# Patient Record
Sex: Female | Born: 1995
Health system: Southern US, Community
[De-identification: ages and names within clinical notes are randomized; demographics above are authoritative.]

---
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
## 2016-01-27 DIAGNOSIS — G43109 Migraine with aura, not intractable, without status migrainosus: Secondary | ICD-10-CM | POA: Diagnosis not present

## 2016-01-27 DIAGNOSIS — Z1389 Encounter for screening for other disorder: Secondary | ICD-10-CM | POA: Diagnosis not present

## 2016-01-27 DIAGNOSIS — N926 Irregular menstruation, unspecified: Secondary | ICD-10-CM | POA: Diagnosis not present

## 2016-01-27 DIAGNOSIS — Z23 Encounter for immunization: Secondary | ICD-10-CM | POA: Diagnosis not present

## 2017-04-10 DIAGNOSIS — Z23 Encounter for immunization: Secondary | ICD-10-CM | POA: Diagnosis not present

## 2017-06-29 DIAGNOSIS — J0101 Acute recurrent maxillary sinusitis: Secondary | ICD-10-CM | POA: Diagnosis not present

## 2017-07-03 DIAGNOSIS — N926 Irregular menstruation, unspecified: Secondary | ICD-10-CM | POA: Diagnosis not present

## 2017-07-03 DIAGNOSIS — G43109 Migraine with aura, not intractable, without status migrainosus: Secondary | ICD-10-CM | POA: Diagnosis not present

## 2018-06-03 DIAGNOSIS — R509 Fever, unspecified: Secondary | ICD-10-CM | POA: Diagnosis not present

## 2018-06-03 DIAGNOSIS — Z6832 Body mass index (BMI) 32.0-32.9, adult: Secondary | ICD-10-CM | POA: Diagnosis not present

## 2018-06-03 DIAGNOSIS — R5383 Other fatigue: Secondary | ICD-10-CM | POA: Diagnosis not present

## 2018-06-03 DIAGNOSIS — R070 Pain in throat: Secondary | ICD-10-CM | POA: Diagnosis not present

## 2018-06-03 DIAGNOSIS — B349 Viral infection, unspecified: Secondary | ICD-10-CM | POA: Diagnosis not present

## 2018-07-19 DIAGNOSIS — Z6832 Body mass index (BMI) 32.0-32.9, adult: Secondary | ICD-10-CM | POA: Diagnosis not present

## 2018-07-19 DIAGNOSIS — N3090 Cystitis, unspecified without hematuria: Secondary | ICD-10-CM | POA: Diagnosis not present

## 2018-10-06 DIAGNOSIS — N926 Irregular menstruation, unspecified: Secondary | ICD-10-CM | POA: Diagnosis not present

## 2018-10-06 DIAGNOSIS — G43109 Migraine with aura, not intractable, without status migrainosus: Secondary | ICD-10-CM | POA: Diagnosis not present

## 2018-10-06 DIAGNOSIS — Z23 Encounter for immunization: Secondary | ICD-10-CM | POA: Diagnosis not present

## 2018-10-06 DIAGNOSIS — Z6834 Body mass index (BMI) 34.0-34.9, adult: Secondary | ICD-10-CM | POA: Diagnosis not present

## 2018-10-28 DIAGNOSIS — G43109 Migraine with aura, not intractable, without status migrainosus: Secondary | ICD-10-CM | POA: Diagnosis not present

## 2018-10-28 DIAGNOSIS — N926 Irregular menstruation, unspecified: Secondary | ICD-10-CM | POA: Diagnosis not present

## 2018-10-28 DIAGNOSIS — Z6834 Body mass index (BMI) 34.0-34.9, adult: Secondary | ICD-10-CM | POA: Diagnosis not present

## 2018-12-19 DIAGNOSIS — Z20828 Contact with and (suspected) exposure to other viral communicable diseases: Secondary | ICD-10-CM | POA: Diagnosis not present

## 2019-02-06 ENCOUNTER — Other Ambulatory Visit: Payer: Self-pay

## 2019-02-06 DIAGNOSIS — Z20822 Contact with and (suspected) exposure to covid-19: Secondary | ICD-10-CM

## 2019-02-07 LAB — NOVEL CORONAVIRUS, NAA: SARS-CoV-2, NAA: NOT DETECTED

## 2019-02-09 ENCOUNTER — Telehealth: Payer: Self-pay | Admitting: Family Medicine

## 2019-02-09 NOTE — Telephone Encounter (Signed)
Negative COVID results given. Patient results "NOT Detected." Caller expressed understanding. ° °

## 2020-01-05 ENCOUNTER — Emergency Department (HOSPITAL_COMMUNITY): Payer: 59

## 2020-01-05 ENCOUNTER — Other Ambulatory Visit: Payer: Self-pay

## 2020-01-05 ENCOUNTER — Encounter (HOSPITAL_COMMUNITY): Payer: Self-pay | Admitting: *Deleted

## 2020-01-05 ENCOUNTER — Emergency Department (HOSPITAL_COMMUNITY)
Admission: EM | Admit: 2020-01-05 | Discharge: 2020-01-05 | Disposition: A | Discharge status: Home / Self Care | Payer: 59 | Attending: Emergency Medicine | Admitting: Emergency Medicine

## 2020-01-05 DIAGNOSIS — Z20822 Contact with and (suspected) exposure to covid-19: Secondary | ICD-10-CM

## 2020-01-05 DIAGNOSIS — J189 Pneumonia, unspecified organism: Secondary | ICD-10-CM | POA: Insufficient documentation

## 2020-01-05 DIAGNOSIS — R509 Fever, unspecified: Secondary | ICD-10-CM | POA: Diagnosis present

## 2020-01-05 LAB — SARS CORONAVIRUS 2 BY RT PCR (HOSPITAL ORDER, PERFORMED IN ~~LOC~~ HOSPITAL LAB): SARS Coronavirus 2: NEGATIVE

## 2020-01-05 MED ORDER — AMOXICILLIN 500 MG PO CAPS: 1000.0000 mg | ORAL_CAPSULE | Freq: Three times a day (TID) | ORAL | 0 refills | Status: AC

## 2020-01-05 MED ORDER — ACETAMINOPHEN 325 MG PO TABS
650.0000 mg | ORAL_TABLET | Freq: Once | ORAL | Status: AC | PRN
  Administered 2020-01-05: 650 mg via ORAL
  Filled 2020-01-05: qty 2

## 2020-01-05 MED ORDER — AZITHROMYCIN 250 MG PO TABS: 250.0000 mg | ORAL_TABLET | Freq: Every day | ORAL | 0 refills | Status: AC

## 2020-01-05 NOTE — ED Provider Notes (Signed)
[facility] EMERGENCY DEPARTMENT Provider Note   CSN: [id number] Arrival date & time: 01/05/20  1119     History Chief Complaint  Patient presents with  . Covid Exposure    Sheri Jacobson is a 24 y.o. female.  Sheri Jacobson is a 24 y.o. female who presented today for evaluation of flu-like symptoms after possible COVID exposure. Pt stated that she has been having fevers, chills, cough, and general malaise for about 3-4 days. She was possibly exposed to COVID sometime last week. No one in her home is currently sick. She was vaccinated against COVID-19 in march 2021. Pt stated that her highest fever was 102 in the ED today. She was given tylenol and her fever has improved. Pt endorses sweating, chills, and a cough that was initially dry but has recently become productive with green sputum. She endorses some mild nausea this morning that has subsided. Denies vomiting, diarrhea, or abdominal pain. Pt stated that she has mild shortness of breath when she is up and walking but feels better with rest. No loss of taste or smell. No other known sick contacts.  No other aggravating or alleviating factors.        History reviewed. No pertinent past medical history.  There are no problems to display for this patient.   History reviewed. No pertinent surgical history.   OB History   No obstetric history on file.     No family history on file.  Social History   Tobacco Use  . Smoking status: Never Smoker  . Smokeless tobacco: Never Used  Substance Use Topics  . Alcohol use: No  . Drug use: No    Home Medications Prior to Admission medications   Medication Sig Start Date End Date Taking? Authorizing Provider  amoxicillin (AMOXIL) 500 MG capsule Take 2 capsules (1,000 mg total) by mouth 3 (three) times daily for 5 days. 01/05/20 01/10/20  Dartha Lodge, PA-C  azithromycin (ZITHROMAX) 250 MG tablet Take 1 tablet (250 mg total) by mouth daily. Take first 2 tablets together, then 1  every day until finished. 01/05/20   Dartha Lodge, PA-C  ibuprofen (ADVIL,MOTRIN) 800 MG tablet Take 1 tablet (800 mg total) by mouth every 8 (eight) hours as needed for pain. 12/10/12   Elwin Mocha, MD    Allergies    Patient has no known allergies.  Review of Systems   Review of Systems  Constitutional: Positive for chills and fever.  HENT: Positive for congestion. Negative for rhinorrhea and sore throat.   Respiratory: Positive for cough and shortness of breath.   Cardiovascular: Negative for chest pain.  Gastrointestinal: Positive for nausea. Negative for abdominal pain, diarrhea and vomiting.  Musculoskeletal: Negative for arthralgias and myalgias.  Skin: Negative for color change and rash.  Neurological: Negative for headaches.  All other systems reviewed and are negative.   Physical Exam Updated Vital Signs BP 108/67   Pulse 95   Temp 98.2 F (36.8 C) (Oral)   Resp 16   Ht 5\' 6"  (1.676 m)   Wt 108.9 kg   LMP 12/05/2019   SpO2 99%   BMI 38.74 kg/m   Physical Exam Vitals and nursing note reviewed.  Constitutional:      General: She is not in acute distress.    Appearance: Normal appearance. She is well-developed. She is not ill-appearing or diaphoretic.     Comments: Well-appearing and in no distress  HENT:     Head: Normocephalic and atraumatic.  Mouth/Throat:     Mouth: Mucous membranes are moist.     Pharynx: Oropharynx is clear.  Eyes:     General:        Right eye: No discharge.        Left eye: No discharge.  Neck:     Comments: No rigidity Cardiovascular:     Rate and Rhythm: Normal rate and regular rhythm.     Heart sounds: Normal heart sounds. No murmur heard.  No friction rub. No gallop.   Pulmonary:     Effort: Pulmonary effort is normal. No respiratory distress.     Breath sounds: Normal breath sounds.     Comments: Respirations equal and unlabored, patient able to speak in full sentences, lungs clear to auscultation  bilaterally Abdominal:     General: Bowel sounds are normal. There is no distension.     Palpations: Abdomen is soft. There is no mass.     Tenderness: There is no abdominal tenderness. There is no guarding.     Comments: Abdomen soft, nondistended, nontender to palpation in all quadrants without guarding or peritoneal signs  Musculoskeletal:        General: No deformity.     Cervical back: Neck supple.  Lymphadenopathy:     Cervical: No cervical adenopathy.  Skin:    General: Skin is warm and dry.     Capillary Refill: Capillary refill takes less than 2 seconds.  Neurological:     Mental Status: She is alert and oriented to person, place, and time.     Coordination: Coordination normal.  Psychiatric:        Mood and Affect: Mood normal.        Behavior: Behavior normal.     ED Results / Procedures / Treatments   Labs (all labs ordered are listed, but only abnormal results are displayed) Labs Reviewed  SARS CORONAVIRUS 2 BY RT PCR (HOSPITAL ORDER, PERFORMED IN [facility])    EKG None  Radiology DG Chest Portable 1 View  Result Date: 01/05/2020 CLINICAL DATA:  Cough question of COVID EXAM: PORTABLE CHEST 1 VIEW COMPARISON:  None. FINDINGS: The heart size and mediastinal contours are within normal limits. Mildly hazy airspace opacity seen at the right lung base. The left lung is clear. No pleural effusion. The visualized skeletal structures are unremarkable. IMPRESSION: Mildly hazy airspace opacity at the right lung base which could be due to atelectasis and/or early infectious etiology. Electronically Signed   By: Jonna Clark M.D.   On: 01/05/2020 15:32    Procedures Procedures (including critical care time)  Medications Ordered in ED Medications  acetaminophen (TYLENOL) tablet 650 mg (650 mg Oral Given 01/05/20 1145)    ED Course  I have reviewed the triage vital signs and the nursing notes.  Pertinent labs & imaging results that were available during  my care of the patient were reviewed by me and considered in my medical decision making (see chart for details).    MDM Rules/Calculators/A&P                          24 year old female who has been vaccinated against Covid presents with 3 to 4 days of fevers, chills, malaise, cough and shortness of breath, she did have a possible Covid exposure last week.  Reports cough has become increasingly productive.  No associated chest pain.  Mild nausea this morning but no abdominal pain or diarrhea.  No other focal  symptoms.  Patient's Covid test here in the emergency department is negative.  Her chest x-ray is concerning for right lower lobe pneumonia and given patient's fever and symptoms with negative Covid test will treat for bacterial pneumonia, with amoxicillin and azithromycin.  If symptoms or not improving after 2 to 3 days of antibiotics I would encourage the patient to get retested for Covid.  Strict return precautions and supportive care discussed.  Patient expresses understanding and agreement.  Discharged home in good condition.  Sheri Jacobson was evaluated in Emergency Department on 01/07/2020 for the symptoms described in the history of present illness. She was evaluated in the context of the global COVID-19 pandemic, which necessitated consideration that the patient might be at risk for infection with the SARS-CoV-2 virus that causes COVID-19. Institutional protocols and algorithms that pertain to the evaluation of patients at risk for COVID-19 are in a state of rapid change based on information released by regulatory bodies including the CDC and federal and state organizations. These policies and algorithms were followed during the patient's care in the ED.  Final Clinical Impression(s) / ED Diagnoses Final diagnoses:  Community acquired pneumonia of right lower lobe of lung  Exposure to COVID-19 virus    Rx / DC Orders ED Discharge Orders         Ordered    amoxicillin (AMOXIL) 500 MG  capsule  3 times daily        01/05/20 1631    azithromycin (ZITHROMAX) 250 MG tablet  Daily        01/05/20 1631           Dartha Lodge, [place] 01/07/20 1249    Bethann Berkshire, MD 01/11/20 [phone]

## 2020-01-05 NOTE — Discharge Instructions (Signed)
Your Covid test today was negative.  Your chest x-ray shows signs of pneumonia, please take antibiotics as directed.  If your symptoms are not improving in 2 to 3 days you should have a repeat Covid test done.   Use Motrin and Tylenol as needed for fevers and chills.  Follow-up with your primary care doctor as needed.

## 2020-01-05 NOTE — ED Triage Notes (Signed)
Possible covid exposure, referred here by PCP

## 2022-03-11 IMAGING — DX DG CHEST 1V PORT
1 series · 1 of 1 positions shown · non-contrast
Comparison: None.

CLINICAL DATA: Cough question of COVID

EXAM:
PORTABLE CHEST 1 VIEW

[chest ap]
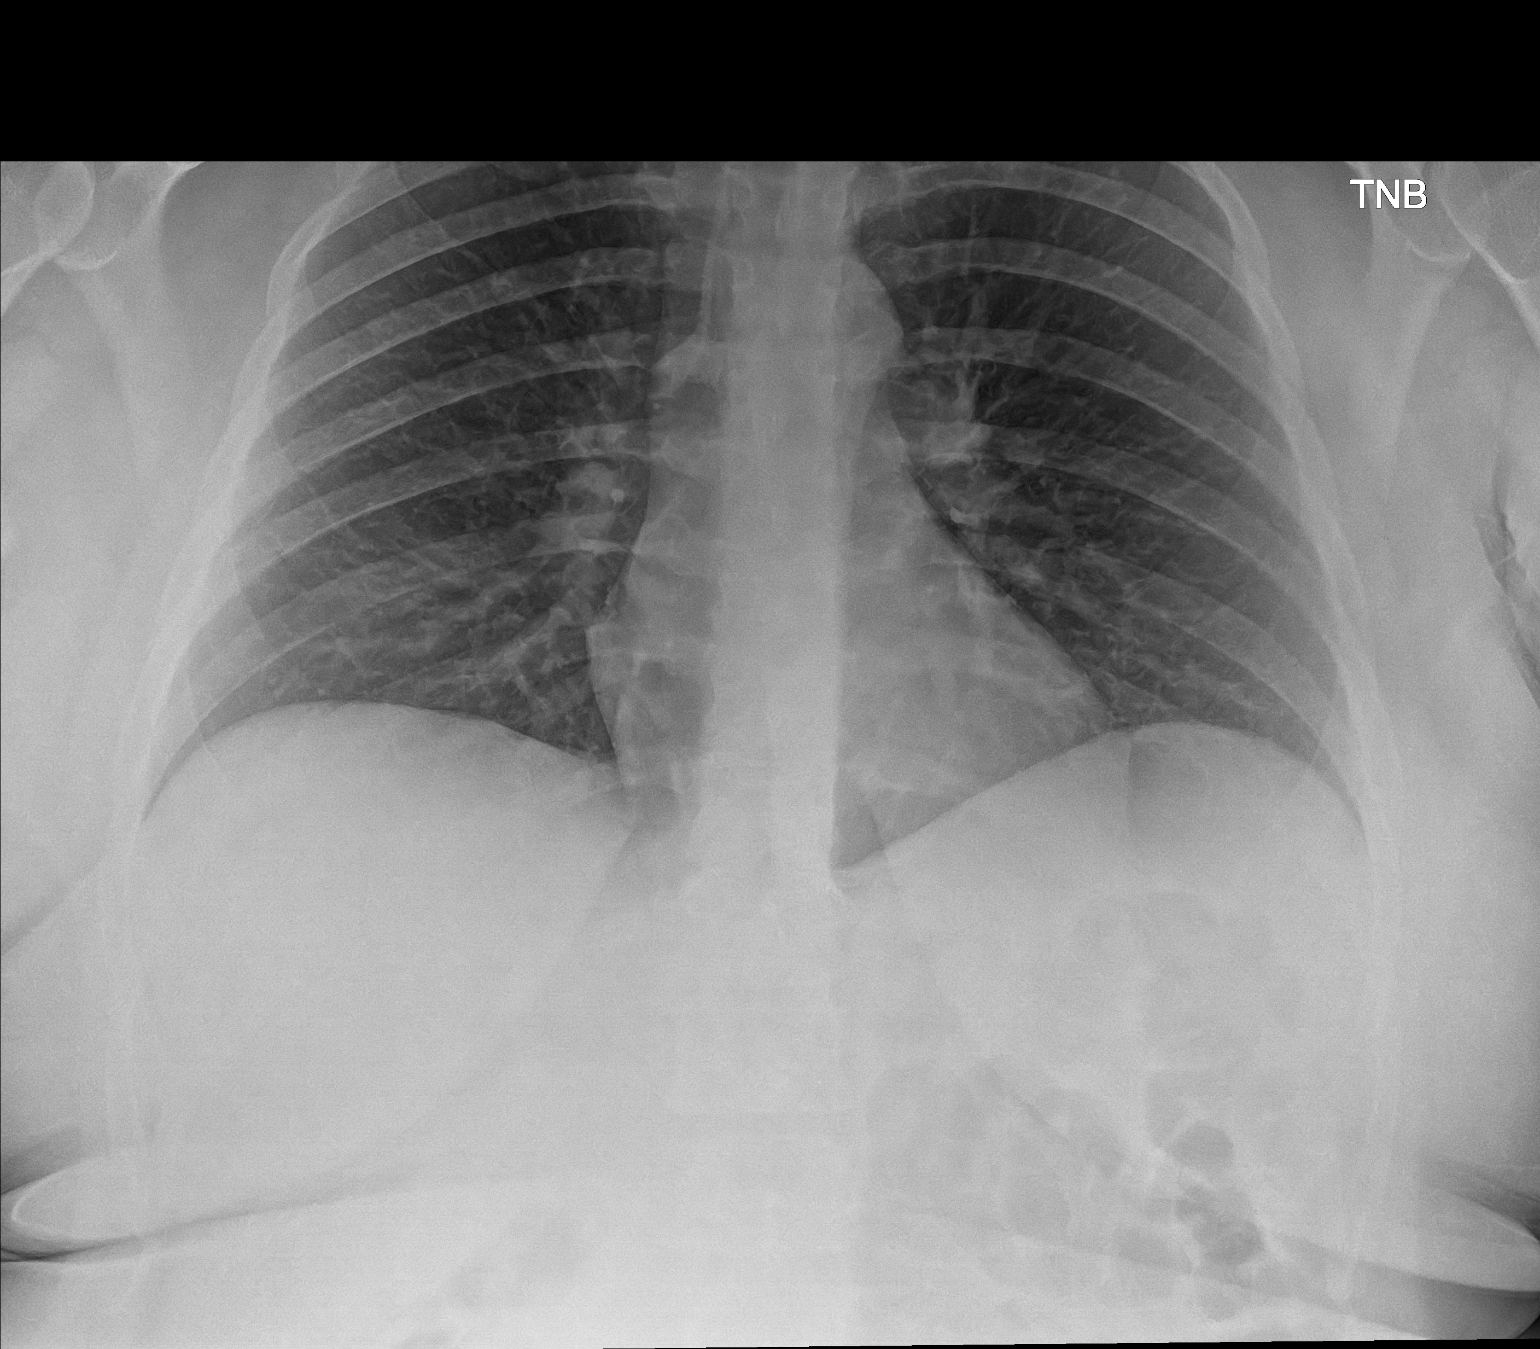

[1 of 1 positions shown; findings below may reference images not displayed]

FINDINGS: The heart size and mediastinal contours are within normal limits.
Mildly hazy airspace opacity seen at the right lung base. The left
lung is clear. No pleural effusion. The visualized skeletal
structures are unremarkable.
IMPRESSION: Mildly hazy airspace opacity at the right lung base which could be
due to atelectasis and/or early infectious etiology.
